# Patient Record
Sex: Female | Born: 1995 | Race: White | Hispanic: No | Marital: Single
Health system: Southern US, Community
[De-identification: ages and names within clinical notes are randomized; demographics above are authoritative.]

## PROBLEM LIST (undated history)

## (undated) DIAGNOSIS — J353 Hypertrophy of tonsils with hypertrophy of adenoids: Secondary | ICD-10-CM

## (undated) DIAGNOSIS — R1110 Vomiting, unspecified: Secondary | ICD-10-CM

## (undated) DIAGNOSIS — K922 Gastrointestinal hemorrhage, unspecified: Secondary | ICD-10-CM

## (undated) DIAGNOSIS — K219 Gastro-esophageal reflux disease without esophagitis: Secondary | ICD-10-CM

## (undated) DIAGNOSIS — Z8781 Personal history of (healed) traumatic fracture: Secondary | ICD-10-CM

## (undated) DIAGNOSIS — T7840XA Allergy, unspecified, initial encounter: Secondary | ICD-10-CM

## (undated) DIAGNOSIS — E669 Obesity, unspecified: Secondary | ICD-10-CM

## (undated) DIAGNOSIS — R51 Headache: Secondary | ICD-10-CM

## (undated) DIAGNOSIS — L709 Acne, unspecified: Secondary | ICD-10-CM

## (undated) DIAGNOSIS — R519 Headache, unspecified: Secondary | ICD-10-CM

## (undated) DIAGNOSIS — J45909 Unspecified asthma, uncomplicated: Secondary | ICD-10-CM

## (undated) HISTORY — DX: Headache, unspecified: R51.9

## (undated) HISTORY — PX: INCISE AND DRAIN ABCESS: PRO64

## (undated) HISTORY — DX: Vomiting, unspecified: R11.10

## (undated) HISTORY — DX: Obesity, unspecified: E66.9

## (undated) HISTORY — DX: Headache: R51

---
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
## 2018-06-21 ENCOUNTER — Encounter (INDEPENDENT_AMBULATORY_CARE_PROVIDER_SITE_OTHER): Payer: Self-pay | Admitting: Neurology

## 2018-09-18 ENCOUNTER — Encounter (HOSPITAL_COMMUNITY): Payer: Self-pay

## 2018-09-18 ENCOUNTER — Other Ambulatory Visit: Payer: Self-pay

## 2018-09-18 ENCOUNTER — Emergency Department (HOSPITAL_COMMUNITY): Payer: Self-pay

## 2018-09-18 ENCOUNTER — Emergency Department (HOSPITAL_COMMUNITY)
Admission: EM | Admit: 2018-09-18 | Discharge: 2018-09-18 | Disposition: A | Discharge status: Home / Self Care | Payer: Self-pay | Attending: Emergency Medicine | Admitting: Emergency Medicine

## 2018-09-18 DIAGNOSIS — Z79899 Other long term (current) drug therapy: Secondary | ICD-10-CM | POA: Insufficient documentation

## 2018-09-18 DIAGNOSIS — J45909 Unspecified asthma, uncomplicated: Secondary | ICD-10-CM | POA: Insufficient documentation

## 2018-09-18 DIAGNOSIS — J189 Pneumonia, unspecified organism: Secondary | ICD-10-CM | POA: Insufficient documentation

## 2018-09-18 DIAGNOSIS — Z7722 Contact with and (suspected) exposure to environmental tobacco smoke (acute) (chronic): Secondary | ICD-10-CM | POA: Insufficient documentation

## 2018-09-18 MED ORDER — AZITHROMYCIN 250 MG PO TABS: 250.0000 mg | ORAL_TABLET | Freq: Every day | ORAL | 0 refills | Status: AC

## 2018-09-18 MED ORDER — LIDOCAINE HCL (PF) 1 % IJ SOLN
INTRAMUSCULAR | Status: AC
  Administered 2018-09-18: 12:00:00
  Filled 2018-09-18: qty 2

## 2018-09-18 MED ORDER — CEFTRIAXONE SODIUM 1 G IJ SOLR
1.0000 g | Freq: Once | INTRAMUSCULAR | Status: AC
  Administered 2018-09-18: 1 g via INTRAMUSCULAR
  Filled 2018-09-18: qty 10

## 2018-09-18 MED ORDER — BENZONATATE 100 MG PO CAPS: 100.0000 mg | ORAL_CAPSULE | Freq: Three times a day (TID) | ORAL | 0 refills | Status: AC

## 2018-09-18 MED ORDER — AZITHROMYCIN 250 MG PO TABS
500.0000 mg | ORAL_TABLET | Freq: Once | ORAL | Status: AC
  Administered 2018-09-18: 500 mg via ORAL
  Filled 2018-09-18: qty 2

## 2018-09-18 NOTE — Discharge Instructions (Signed)
You need a repeat chest xray in 1 month to make sure area of infection clears.

## 2018-09-18 NOTE — ED Provider Notes (Signed)
[facility] EMERGENCY DEPARTMENT Provider Note   CSN: [id number] Arrival date & time: 09/18/18  1010     History   Chief Complaint Chief Complaint  Patient presents with  . Cough    HPI Ebony Nelson is a 22 y.o. female.  The history is provided by the patient. No language interpreter was used.  Cough  This is a new problem. The current episode started yesterday. The problem occurs constantly. The problem has been gradually worsening. The cough is productive of sputum. The maximum temperature recorded prior to her arrival was 101 to 101.9 F. The fever has been present for 3 to 4 days. Pertinent negatives include no chest pain and no shortness of breath.    Past Medical History:  Diagnosis Date  . Acid reflux   . Acne   . Allergy   . Asthma    daily and prn inhalers  . Gastrointestinal bleeding   . Headache   . History of ankle fracture summer 2013   is in PT  . Obesity   . Tonsillar and adenoid hypertrophy 10/2012   snores during sleep and stops breathing, per mother  . Vomiting     Patient Active Problem List   Diagnosis Date Noted  . Migraine without aura and without status migrainosus, not intractable 02/21/2016  . Tension headache 02/21/2016  . Obesity peds (BMI >=95 percentile) 04/14/2014  . RUQ abdominal pain 08/06/2013  . History of gastroesophageal reflux (GERD) 07/24/2013  . Hematemesis 07/24/2013  . Hematochezia 07/24/2013  . 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 Vomiting   . Abnormal electroencephalogram (EEG) 06/12/2013  . Visual hallucinations 06/12/2013  . Migraine headache 06/12/2013  . Morbid obesity (HCC) 02/03/2013  . Acanthosis 02/03/2013  . Elevated blood pressure (not hypertension) 02/03/2013  . Hyperlipidemia, mixed 02/03/2013  . Fatty liver disease, nonalcoholic 02/03/2013    Past Surgical History:  Procedure Laterality Date  . INCISE AND DRAIN ABCESS  age 34 yr.   buttock  . TONSILLECTOMY AND ADENOIDECTOMY   10/21/2012   Procedure: TONSILLECTOMY AND ADENOIDECTOMY;  Surgeon: Darletta Moll, MD;  Location: [facility];  Service: ENT;  Laterality: Bilateral;     OB History   None      Home Medications    Prior to Admission medications   Medication Sig Start Date End Date Taking? Authorizing Provider  albuterol (PROVENTIL HFA;VENTOLIN HFA) 108 (90 BASE) MCG/ACT inhaler Inhale 2 puffs into the lungs every 6 (six) hours as needed for wheezing.     [provider]  azithromycin (ZITHROMAX) 250 MG tablet Take 1 tablet (250 mg total) by mouth daily. Take first 2 tablets together, then 1 every day until finished. 09/18/18   Elson Areas, PA-C  benzonatate (TESSALON) 100 MG capsule Take 1 capsule (100 mg total) by mouth every 8 (eight) hours. 09/18/18   Elson Areas, PA-C  cetirizine (ZYRTEC) 10 MG tablet Take 10 mg by mouth daily.    [provider]  mometasone [facility]) 220 MCG/INH inhaler Inhale 2 puffs into the lungs 2 (two) times daily.    [provider]  montelukast (SINGULAIR) 10 MG tablet Take 5 mg by mouth at bedtime.     [provider]  SUMAtriptan (IMITREX) 25 MG tablet Take 25 mg by mouth every 2 (two) hours as needed for migraine. May repeat in 2 hours if headache persists or recurs.    [provider]  topiramate (TOPAMAX) 25 MG tablet Take 1 tablet (  25 mg total) by mouth 2 (two) times daily. 02/21/16   Keturah Shavers, MD    Family History Family History  Problem Relation Age of Onset  . Asthma Father   . Obesity Father   . Anxiety disorder Father   . Bipolar disorder Father   . GER disease Father   . Hypertension Maternal Grandmother   . Heart disease Maternal Grandmother   . Asthma Maternal Grandmother   . Obesity Maternal Grandmother   . Heart attack Maternal Grandmother        before age 31  . Obesity Mother   . GER disease Mother   . Obesity Brother   . Heart attack Maternal Grandfather        before age 53    . Schizophrenia Paternal Grandmother   . Obesity Maternal Uncle   . Epilepsy Cousin   . Schizophrenia Paternal Uncle   . Celiac disease Neg Hx   . Ulcers Neg Hx   . Pancreatitis Neg Hx   . Cholelithiasis Neg Hx     Social History Social History   Tobacco Use  . Smoking status: Passive Smoke Exposure - Never Smoker  . Smokeless tobacco: Never Used  . Tobacco comment: outside smokers at home  Substance Use Topics  . Alcohol use: No  . Drug use: No     Allergies   Motrin [ibuprofen]   Review of Systems Review of Systems  Respiratory: Positive for cough. Negative for shortness of breath.   Cardiovascular: Negative for chest pain.  All other systems reviewed and are negative.    Physical Exam Updated Vital Signs BP 109/62 (BP Location: Right Arm)   Pulse 97   Temp 98.2 F (36.8 C) (Oral)   Resp 12   Ht 5\' 9"  (1.753 m)   Wt (!) 163.3 kg   LMP 09/01/2018   SpO2 95%   BMI 53.16 kg/m   Physical Exam  Constitutional: She is oriented to person, place, and time. She appears well-developed and well-nourished.  HENT:  Head: Normocephalic.  Right Ear: External ear normal.  Left Ear: External ear normal.  Nose: Nose normal.  Mouth/Throat: Oropharynx is clear and moist.  Eyes: EOM are normal.  Neck: Normal range of motion.  Cardiovascular: Normal rate.  Pulmonary/Chest: Effort normal. She has no wheezes.  Harsh cough  Abdominal: She exhibits no distension.  Musculoskeletal: Normal range of motion.  Neurological: She is alert and oriented to person, place, and time.  Skin: Skin is warm.  Psychiatric: She has a normal mood and affect.  Nursing note and vitals reviewed.    ED Treatments / Results  Labs (all labs ordered are listed, but only abnormal results are displayed) Labs Reviewed - No data to display  EKG None  Radiology Dg Chest 2 View  Result Date: 09/18/2018 CLINICAL DATA:  Productive cough, chest congestion, shortness of breath, and chest  tightness for the past 3-4 days. History of asthma. EXAM: CHEST - 2 VIEW COMPARISON:  Portable chest x-ray of December 12, 2017 FINDINGS: The right lung is adequately inflated. The interstitial markings are mildly prominent. On the left there is confluent airspace opacity in the perihilar region posteriorly likely in the superior segment of the left lower lobe. There is no pleural effusion. The heart and pulmonary vascularity are normal. The mediastinum is normal in width. IMPRESSION: Mild chronic reactive airway changes in the interstitium. Acute pneumonia in the superior segment of the left lower lobe. Follow-up radiographs following anticipated antibiotic therapy are  recommended if the patient's symptoms do not completely resolve. Electronically Signed   By: David  SwazilandJordan M.D.   On: 09/18/2018 11:17    Procedures Procedures (including critical care time)  Medications Ordered in ED Medications  cefTRIAXone (ROCEPHIN) injection 1 g (1 g Intramuscular Given 09/18/18 1154)  azithromycin (ZITHROMAX) tablet 500 mg (500 mg Oral Given 09/18/18 1154)  lidocaine (PF) (XYLOCAINE) 1 % injection (  Given 09/18/18 1154)     Initial Impression / Assessment and Plan / ED Course  I have reviewed the triage vital signs and the nursing notes.  Pertinent labs & imaging results that were available during my care of the patient were reviewed by me and considered in my medical decision making (see chart for details).     Pt given Rocephin IM, zithromax 500mg  here.  Pt given rx for zithromax.  Pt advised of need for follow up chest xray.   Final Clinical Impressions(s) / ED Diagnoses   Final diagnoses:  Community acquired pneumonia, unspecified laterality    ED Discharge Orders         Ordered    azithromycin (ZITHROMAX) 250 MG tablet  Daily     09/18/18 1145    benzonatate (TESSALON) 100 MG capsule  Every 8 hours     09/18/18 1145        An After Visit Summary was printed and given to the  patient.    Elson AreasSofia, Yanil Dawe K, PA-C 09/18/18 1201    Samuel JesterMcManus, Kathleen, DO 09/18/18 1600

## 2018-09-18 NOTE — ED Triage Notes (Signed)
Pt has had a cough for 3 days. Productive cough with clear sputum. Has been taking Dayquil and Nyquil. NAD.

## 2019-04-09 ENCOUNTER — Telehealth: Payer: Medicaid Other | Admitting: Nurse Practitioner

## 2019-04-09 DIAGNOSIS — L509 Urticaria, unspecified: Secondary | ICD-10-CM

## 2019-04-09 MED ORDER — PREDNISONE 10 MG (21) PO TBPK: ORAL_TABLET | ORAL | 0 refills | Status: AC

## 2019-04-09 NOTE — Progress Notes (Signed)
E Visit for Rash  We are sorry that you are not feeling well. Here is how we plan to help!    Based on what you shared with me you may have a virus or an allergic reaction.  Avoid contact with pregnant women until a diagnosis is made.  Most viral rashes are contagious (especially if a fever is present).  You can return to work or school after the rash is gone or when your doctor says it is safe to return with the rash.     Prednisone 10 mg daily for 6 days (see taper instructions below)  Directions for 6 day taper: Day 1: 2 tablets before breakfast, 1 after both lunch & dinner and 2 at bedtime Day 2: 1 tab before breakfast, 1 after both lunch & dinner and 2 at bedtime Day 3: 1 tab at each meal & 1 at bedtime Day 4: 1 tab at breakfast, 1 at lunch, 1 at bedtime Day 5: 1 tab at breakfast & 1 tab at bedtime Day 6: 1 tab at breakfast     HOME CARE:   Take cool showers and avoid direct sunlight.  Apply cool compress or wet dressings.  Take a bath in an oatmeal bath.  Sprinkle content of one Aveeno packet under running faucet with comfortably warm water.  Bathe for 15-20 minutes, 1-2 times daily.  Pat dry with a towel. Do not rub the rash.  Use hydrocortisone cream.  Take an antihistamine like Benadryl for widespread rashes that itch.  The adult dose of Benadryl is 25-50 mg by mouth 4 times daily.  Caution:  This type of medication may cause sleepiness.  Do not drink alcohol, drive, or operate dangerous machinery while taking antihistamines.  Do not take these medications if you have prostate enlargement.  Read package instructions thoroughly on all medications that you take.  GET HELP RIGHT AWAY IF:   Symptoms don't go away after treatment.  Severe itching that persists.  If you rash spreads or swells.  If you rash begins to smell.  If it blisters and opens or develops a yellow-brown crust.  You develop a fever.  You have a sore throat.  You become short of  breath.  MAKE SURE YOU:  Understand these instructions. Will watch your condition. Will get help right away if you are not doing well or get worse.  Thank you for choosing an e-visit. Your e-visit answers were reviewed by a board certified advanced clinical practitioner to complete your personal care plan. Depending upon the condition, your plan could have included both over the counter or prescription medications. Please review your pharmacy choice. Be sure that the pharmacy you have chosen is open so that you can pick up your prescription now.  If there is a problem you may message your provider in MyChart to have the prescription routed to another pharmacy. Your safety is important to us. If you have drug allergies check your prescription carefully.  For the next 24 hours, you can use MyChart to ask questions about today's visit, request a non-urgent call back, or ask for a work or school excuse from your e-visit provider. You will get an email in the next two days asking about your experience. I hope that your e-visit has been valuable and will speed your recovery.    5-10 minutes spent reviewing and documenting in chart.   

## 2019-04-19 ENCOUNTER — Telehealth: Payer: Medicaid Other | Admitting: Physician Assistant

## 2019-04-19 DIAGNOSIS — R21 Rash and other nonspecific skin eruption: Secondary | ICD-10-CM

## 2019-04-19 NOTE — Progress Notes (Signed)
Based on what you shared with me, I feel your condition warrants further evaluation and I recommend that you be seen for a face to face office visit.  Giving recurrence of symptoms despite treatment and the atypical characteristics of the rash (there are areas that seem to be amorphous in shape with central clearing which can occur with issues other than hives which you were diagnosed with last time) I recommend that you be seen in person for assessment so the most appropriate treatment can be given.  There may be an underlying trigger that we need to figure out so that these do not keep recurring.    NOTE: If you entered your credit card information for this eVisit, you will not be charged. You may see a "hold" on your card for the $35 but that hold will drop off and you will not have a charge processed.  If you are having a true medical emergency please call 911.  If you need an urgent face to face visit, [facility] has four urgent care centers for your convenience.      [email or web address] to reserve your spot online an avoid wait times  [facility] [address] [place], [place] [address] Modified hours of operation: Monday-Friday, 12 PM to 6 PM  Saturday & Sunday 10 AM to 4 PM *Across the street from [facility] [facility]New Address[facility] [address], [place], [place] [address] *Just off [address], across the road from [place] hours of operation: Monday-Friday, 12 PM to 6 PM  Closed Saturday & Sunday   The following sites will take your insurance:  . [facility] [place] a Provider at this Location  [address] [place], [place] [address] . 10 am to 8 pm Monday-Friday . 12 pm to 8 pm Saturday-Sunday   . [facility] at [place] a Provider at this Location  [address], [place], [place] [address] . 8 am to 8 pm Monday-Friday . 9 am to 6 pm Saturday . 11 am to 6 pm Sunday   . [facility] at [place] Get Driving Directions  [address] [place],  [address] . 8 am to 8 pm Monday-Friday . 8 am to 4 pm Saturday-Sunday   Your e-visit answers were reviewed by a board certified advanced clinical practitioner to complete your personal care plan.  Thank you for using e-Visits.
